# Patient Record
Sex: Female | Born: 1955 | Race: White | Hispanic: No | Marital: Married | Smoking: Former smoker
Health system: Southern US, Community
[De-identification: ages and names within clinical notes are randomized; demographics above are authoritative.]

## PROBLEM LIST (undated history)

## (undated) DIAGNOSIS — J309 Allergic rhinitis, unspecified: Secondary | ICD-10-CM

## (undated) DIAGNOSIS — E785 Hyperlipidemia, unspecified: Secondary | ICD-10-CM

## (undated) DIAGNOSIS — E119 Type 2 diabetes mellitus without complications: Secondary | ICD-10-CM

## (undated) DIAGNOSIS — K219 Gastro-esophageal reflux disease without esophagitis: Secondary | ICD-10-CM

## (undated) DIAGNOSIS — F329 Major depressive disorder, single episode, unspecified: Secondary | ICD-10-CM

## (undated) DIAGNOSIS — I2510 Atherosclerotic heart disease of native coronary artery without angina pectoris: Secondary | ICD-10-CM

## (undated) DIAGNOSIS — L409 Psoriasis, unspecified: Secondary | ICD-10-CM

## (undated) DIAGNOSIS — F32A Depression, unspecified: Secondary | ICD-10-CM

## (undated) DIAGNOSIS — I10 Essential (primary) hypertension: Secondary | ICD-10-CM

## (undated) HISTORY — DX: Allergic rhinitis, unspecified: J30.9

## (undated) HISTORY — DX: Gastro-esophageal reflux disease without esophagitis: K21.9

## (undated) HISTORY — DX: Type 2 diabetes mellitus without complications: E11.9

## (undated) HISTORY — DX: Atherosclerotic heart disease of native coronary artery without angina pectoris: I25.10

## (undated) HISTORY — DX: Psoriasis, unspecified: L40.9

## (undated) HISTORY — DX: Essential (primary) hypertension: I10

## (undated) HISTORY — PX: OTHER SURGICAL HISTORY: SHX169

## (undated) HISTORY — DX: Hyperlipidemia, unspecified: E78.5

## (undated) HISTORY — DX: Depression, unspecified: F32.A

## (undated) HISTORY — DX: Major depressive disorder, single episode, unspecified: F32.9

---

## 1998-04-25 ENCOUNTER — Ambulatory Visit (HOSPITAL_COMMUNITY): Admission: RE | Admit: 1998-04-25 | Discharge: 1998-04-25 | Payer: Self-pay | Admitting: Family Medicine

## 1998-04-25 ENCOUNTER — Encounter: Payer: Self-pay | Admitting: Family Medicine

## 1998-08-10 ENCOUNTER — Encounter: Payer: Self-pay | Admitting: Family Medicine

## 1998-08-10 ENCOUNTER — Ambulatory Visit (HOSPITAL_COMMUNITY): Admission: RE | Admit: 1998-08-10 | Discharge: 1998-08-10 | Payer: Self-pay | Admitting: Family Medicine

## 2001-11-09 ENCOUNTER — Encounter: Admission: RE | Admit: 2001-11-09 | Discharge: 2001-11-09 | Payer: Self-pay | Admitting: Family Medicine

## 2001-11-09 ENCOUNTER — Encounter: Payer: Self-pay | Admitting: Family Medicine

## 2003-02-25 ENCOUNTER — Encounter: Admission: RE | Admit: 2003-02-25 | Discharge: 2003-02-25 | Payer: Self-pay | Admitting: Family Medicine

## 2003-02-25 ENCOUNTER — Encounter: Payer: Self-pay | Admitting: Family Medicine

## 2003-08-01 ENCOUNTER — Encounter: Admission: RE | Admit: 2003-08-01 | Discharge: 2003-08-01 | Payer: Self-pay | Admitting: Family Medicine

## 2003-08-24 ENCOUNTER — Encounter (INDEPENDENT_AMBULATORY_CARE_PROVIDER_SITE_OTHER): Payer: Self-pay | Admitting: Specialist

## 2003-08-24 ENCOUNTER — Ambulatory Visit (HOSPITAL_COMMUNITY): Admission: RE | Admit: 2003-08-24 | Discharge: 2003-08-24 | Payer: Self-pay | Admitting: Orthopedic Surgery

## 2003-08-24 ENCOUNTER — Ambulatory Visit (HOSPITAL_BASED_OUTPATIENT_CLINIC_OR_DEPARTMENT_OTHER): Admission: RE | Admit: 2003-08-24 | Discharge: 2003-08-24 | Payer: Self-pay | Admitting: Orthopedic Surgery

## 2004-10-08 ENCOUNTER — Encounter: Admission: RE | Admit: 2004-10-08 | Discharge: 2004-10-08 | Payer: Self-pay | Admitting: *Deleted

## 2004-10-09 ENCOUNTER — Ambulatory Visit (HOSPITAL_COMMUNITY): Admission: RE | Admit: 2004-10-09 | Discharge: 2004-10-10 | Payer: Self-pay | Admitting: *Deleted

## 2004-12-11 ENCOUNTER — Observation Stay (HOSPITAL_COMMUNITY): Admission: EM | Admit: 2004-12-11 | Discharge: 2004-12-12 | Payer: Self-pay | Admitting: Emergency Medicine

## 2005-07-08 HISTORY — PX: TOTAL ABDOMINAL HYSTERECTOMY W/ BILATERAL SALPINGOOPHORECTOMY: SHX83

## 2005-11-18 ENCOUNTER — Encounter: Admission: RE | Admit: 2005-11-18 | Discharge: 2005-11-18 | Payer: Self-pay | Admitting: Family Medicine

## 2005-11-22 ENCOUNTER — Encounter: Admission: RE | Admit: 2005-11-22 | Discharge: 2005-11-22 | Payer: Self-pay | Admitting: Family Medicine

## 2005-11-29 ENCOUNTER — Encounter: Admission: RE | Admit: 2005-11-29 | Discharge: 2005-11-29 | Payer: Self-pay | Admitting: Family Medicine

## 2005-12-11 ENCOUNTER — Other Ambulatory Visit: Admission: RE | Admit: 2005-12-11 | Discharge: 2005-12-11 | Payer: Self-pay | Admitting: Obstetrics and Gynecology

## 2006-04-22 ENCOUNTER — Encounter (INDEPENDENT_AMBULATORY_CARE_PROVIDER_SITE_OTHER): Payer: Self-pay | Admitting: *Deleted

## 2006-04-22 ENCOUNTER — Inpatient Hospital Stay (HOSPITAL_COMMUNITY): Admission: RE | Admit: 2006-04-22 | Discharge: 2006-04-24 | Payer: Self-pay | Admitting: Obstetrics and Gynecology

## 2008-12-20 ENCOUNTER — Encounter: Admission: RE | Admit: 2008-12-20 | Discharge: 2008-12-20 | Payer: Self-pay | Admitting: Family Medicine

## 2009-10-30 ENCOUNTER — Encounter: Admission: RE | Admit: 2009-10-30 | Discharge: 2009-10-30 | Payer: Self-pay | Admitting: Gastroenterology

## 2009-10-30 HISTORY — PX: ESOPHAGOGASTRODUODENOSCOPY: SHX1529

## 2009-10-30 HISTORY — PX: COLONOSCOPY: SHX174

## 2010-11-23 NOTE — Cardiovascular Report (Signed)
Heidi Serrano, Heidi Serrano NO.:  [id number]   MEDICAL RECORD NO.:  [id number]          PATIENT TYPE:  OIB   LOCATION:  2899                         FACILITY:  [facility]   PHYSICIAN:  Lyn Records III, M.D.DATE OF BIRTH:  [birth year]   DATE OF PROCEDURE:  10/09/2004  DATE OF DISCHARGE:                              CARDIAC CATHETERIZATION   INDICATION FOR PROCEDURE:  Totally occluded distal right coronary.   PROCEDURE PERFORMED:  Angioplasty of the distal right coronary.   DESCRIPTION:  The patient underwent diagnostic catheterization by Dr.  Fraser Din with a 6-French system. The patient has totally occluded distal RCA  with left-to-right collaterals noted on angiography. As the patient has been  having symptomatic exertional angina and the lesion appeared favorable for  possible recanalization of CTO, we decided to proceed. The patient was given  4800 units of IV heparin and the ACT was documented to be greater than 250  seconds. We then used an over the wire system, a BMW wire, followed by a Hi-  Torque intermediate wire followed by a CrossIt XT 100 and followed by a  choice PT wire. We were able to penetrate the total occlusion and to get  very near the exit into the distal bed but the wire would continue to be  hung up and could never be freely manipulated into the distal vascular  territory. After approximately 50 minutes of wire manipulation, we  terminated the case. The patient remained stable and Angioseal arteriotomy  closure was performed.  Good hemostasis was achieved. Good pedal pulse was  noted.   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   PLAN:  Medical therapy per Dr. Hillary Bow.      HWS/MEDQ  D:  10/09/2004  T:  10/09/2004  Job:  [id number]   cc:   Gretta Arab. Valentina Lucks, M.D.  [address] [place]  [place] [address]  Fax: [phone]

## 2010-11-23 NOTE — Op Note (Signed)
NAMEMARCO, ADELSON NO.:  [id number]   MEDICAL RECORD NO.:  [id number]          PATIENT TYPE:  INP   LOCATION:  9310                          FACILITY:  [facility]   PHYSICIAN:  Gerald Leitz, MD          DATE OF BIRTH:  [birth year]   DATE OF PROCEDURE:  04/22/2006  DATE OF DISCHARGE:                                 OPERATIVE REPORT   PREOPERATIVE DIAGNOSIS:  Symptomatic uterine fibroids.   POSTOPERATIVE DIAGNOSIS:  Symptomatic uterine fibroids.   PROCEDURE:  Total abdominal hysterectomy, bilateral salpingo-oophorectomy  with extensive lysis of adhesions.   SURGEON:  Gerald Leitz, MD   ASSISTANT:  Dr. Elesa Massed. __________   ANESTHESIA:  General.   SPECIMEN:  Uterus, fallopian tubes and ovaries sent to pathology.  Estimated  blood loss 75 mL.  Urine output 200 mL.  Fluids 2 liters of lactated  Ringer's.   COMPLICATIONS:  None   FINDINGS:  A 14-week size fibroid uterus with adhesions of the uterus to the  anterior abdominal wall.  Anterior peritoneum and adhesions of the bladder  to the uterus as well as adhesions of the colon to the left ovary and  fallopian tube.  Both ovaries appeared normal.   DESCRIPTION OF PROCEDURE:  The patient was taken to the operating room where  she was placed under general anesthesia.  She was then prepped and draped in  the usual sterile fashion.  A Pfannenstiel skin incision was made with a  scalpel, and carried down through the underlying layer of fascia.  The  fascia was incised in the midline.  This incision was extended laterally  with Mayo scissors.  The superior aspect of fascial incision was grasped  with Kocher clamps and elevated.  The underlying rectus muscles were  dissected off with Mayo scissors.  This was repeated on the inferior aspect  of the fascial incision.  Peritoneum was identified and entered sharply with  Metzenbaum scissors.  This was extended superiorly-inferiorly.  Upon  entering the abdomen, the patient was  noted to have extensive adhesions of  the uterus to the anterior abdominal wall.  A Balfour retractor was placed.  Bladder blade could not be placed, at that time, due to the adhesions.  The  uterus was grasped with a Kelly clamp at both cornu elevated; and the uterus  was dissected from the anterior peritoneum with Metzenbaum scissors.  Lysis  of adhesions took approximately 1 hour of the total operating time.   Once the uterus was dissected from the anterior uterine wall the round  ligaments were identified.  The were suture ligated, and transected  bilaterally.  The uterovesical peritoneum was identified.  The bladder flap  was created with Metzenbaum scissors and blunt dissection.  Attention was  turned to the right infundibulopelvic ligament.  The ureter was identified  on the right; and once this was identified, the right infundibulopelvic  ligament was clamped and was transected with Mayo scissors.  The  infundibulopelvic ligament was then ligated with a free tie of #0 Vicryl  followed by a suture  ligature of #0 Vicryl.  Excellent hemostasis was noted.  Due to the adhesions of the left ovary, the utero-ovarian ligament was  clamped with the Heaney clamp and then transected with Mayo scissors.  It  was then suture ligated with the #0 free tie followed by a suture ligature  of #0 Vicryl.  Excellent hemostasis was noted.  The uterine vessels were  skeletonized.  The uterine vessels were then clamped with Heaney clamps  transected and suture ligated with #0 Vicryl bilaterally.  The cardinal  ligaments were then clamped with Heaney clamp, transected, and suture  ligated with #0 Vicryl.  Uterosacrals were clamped with Heaney clamps,  transected, and suture ligated with #0 Vicryl.  The uterus and cervix were  then removed using Mayo scissors.  The vaginal cuff was closed with suture  of #0 Vicryl in a running-locked fashion.  Excellent hemostasis was noted.   Attention was turned to the  left ovary which was grasped with a Babcock  clamp.  The adhesions of the left ovary were removed with Metzenbaum  scissors.  Again, the adhesions were noted to involve the left ovary and  left fallopian tube as well as the colon the infundibulopelvic ligament on  the left was identified.  It was clamped with a Heaney clamp, transected;  and it was then ligated with a free tie of #0 Vicryl.  Adequate hemostasis  was noted.  The abdomen and pelvis were then copiously irrigated with warm  saline.  Excellent hemostasis was noted.  All instruments were removed from  the abdomen.  The peritoneum was closed with 2-0 Vicryl in a running  fashion.  The fascia was closed with #0 PDS in a running fashion.  The skin  was closed with staples.  Sponge, lap, and needle counts were correct x2.   Two grams of Ancef were given at the beginning of the case.  There were no  complications. The patient was awakened from anesthesia and taken to the  recovery room in stable condition.      Gerald Leitz, MD  Electronically Signed     TC/MEDQ  D:  04/22/2006  T:  04/23/2006  Job:  [id number]

## 2010-11-23 NOTE — Discharge Summary (Signed)
NAMENAMYA, VOGES NO.:  [id number]   MEDICAL RECORD NO.:  [id number]          PATIENT TYPE:  INP   LOCATION:  9310                          [facility]ACILITY[facility]   PHYSICIAN:  Gerald Leitz, MD          DATE OF BIRTH:  [birth year]   DATE OF ADMISSION:  04/22/2006  DATE OF DISCHARGE:  04/24/2006                                 DISCHARGE SUMMARY   INDICATION FOR ADMISSION:  Menorrhagia, symptomatic uterine fibroids.   BRIEF HOSPITAL COURSE:  The patient underwent total abdominal hysterectomy,  bilateral salpingo-oophorectomy on April 22, 2006 due to symptomatic  uterine fibroids.  She did well postoperatively with hemoglobin of 12 on  postoperative day #1.  She received routine postoperative care.  Was  discharged on April 24, 2006 with complete bowel and bladder function.  She was discharged home on the following medications:  Motrin, Percocet.   She will follow up with Dr. Richardson Dopp in four weeks for postoperative visit.   CONDITION ON DISCHARGE:  Improved and stable.   ACTIVITY AT DISCHARGE:  Ad lib.  Pelvic rest for six weeks.      Gerald Leitz, MD  Electronically Signed     TC/MEDQ  D:  04/24/2006  T:  04/26/2006  Job:  [id number]

## 2010-11-23 NOTE — H&P (Signed)
Heidi Serrano, Heidi Serrano NO.:  [id number]   MEDICAL RECORD NO.:  [id number]          PATIENT TYPE:  AMB   LOCATION:  SDC                           [facility]   PHYSICIAN:  Gerald Leitz, MD          DATE OF BIRTH:  06-18-1956   DATE OF ADMISSION:  04/22/2006  DATE OF DISCHARGE:                                HISTORY & PHYSICAL   HISTORY:  The patient is a 55 year old G1, P1-0-0-1 with history of  symptomatic uterine fibroids.  She has failed therapy with Mirena IUD.  She  also has a history of coronary artery disease, is not a candidate for any  type of estrogen therapy.  She desires definitive therapy via total  abdominal hysterectomy and bilateral salpingo-oophorectomy.  She has been  cleared by Dr. Eldridge Dace with cardiology for this surgery.   PAST OBSTETRICAL HISTORY:  C-section x1 in 1975.   PAST GYNECOLOGIC HISTORY:  No history of sexually transmitted diseases.  Last Pap smear was June 2007, this was normal.  No history of abnormal Pap  smears.   PAST MEDICAL HISTORY:  Hypertension, angina, diverticulitis, coronary artery  disease, hypercholesterolemia.   PAST SURGICAL HISTORY:  C-section x1, laparoscopic tubal ligation,  angioplasty.   MEDICATIONS:  1. Toprol XL 100 mg one p.o. daily.  2. __________ 40 mg one p.o. daily.  3. Isosorbide 60 mg one p.o. daily.  4. Hydrochlorothiazide 25 mg half tablet as needed.  5. Prilosec OTC one daily.  6. Aspirin 325 mg one daily; the patient advised to stop this 2 weeks      prior to surgery.  7. Bupropion SR 150 mg one p.o. daily.   ALLERGIES:  NO KNOWN DRUG ALLERGIES.   SOCIAL HISTORY:  The patient is married.  She denies tobacco use, quit in  January 2000.  Occasional alcohol.  No illicit drug use.   FAMILY HISTORY:  Maternal grandmother with endometrial cancer.  Mother with  possible endometrial cancer.   REVIEW OF SYSTEMS:  Negative except as stated in the history of current  illness.   PHYSICAL  EXAMINATION:  VITAL SIGNS:  Blood pressure 130/82, weight 184  pounds, heart rate 76.  CARDIOVASCULAR:  Regular rate and rhythm.  LUNGS:  Clear to auscultation bilaterally.  ABDOMEN:  Soft, nontender, positive bowel sounds.  EXTREMITIES:  No clubbing, cyanosis, or edema.  PELVIC EXAM:  Normal external female genitalia.  No vulvar, vaginal,  cervical lesions noted.  Bimanual exam reveals a 14-week-size uterus.  No  adnexal masses or tenderness.   IMPRESSION AND PLAN:  Symptomatic uterine fibroids.  The patient desires  definitive therapy with total abdominal hysterectomy/bilateral salpingo-  oophorectomy.  Risks, benefits, alternatives of surgery are discussed with  the patient including, but not limited to, infection, bleeding, damage to  bowel, bladder, surrounding organs with need for further surgery, risk for  deep vein thrombosis discussed as well.  The patient understands the above  risks and desires to proceed with total abdominal hysterectomy and bilateral  salpingo-oophorectomy.      Gerald Leitz, MD  Electronically Signed     TC/MEDQ  D:  04/21/2006  T:  04/21/2006  Job:  [id number]

## 2010-11-23 NOTE — Cardiovascular Report (Signed)
[name], Heidi Serrano NO.:  [id number]   MEDICAL RECORD NO.:  [id number]          PATIENT TYPE:  OIB   LOCATION:  2899                         FACILITY:  [facility]   PHYSICIAN:  Meade Maw, M.D.    DATE OF BIRTH:  1956/01/29   DATE OF PROCEDURE:  10/09/2004  DATE OF DISCHARGE:                              CARDIAC CATHETERIZATION   INDICATIONS FOR PROCEDURE:  This is a 55 year old female with ongoing chest  pain for approximately 6 weeks.  Stress Cardiolite was performed revealing  inferior posterolateral ischemia.   DESCRIPTION OF PROCEDURE:  After obtaining written and informed consent, the  patient was brought to the cardiac catheterization in a sedative state.  Preop sedation was achieved using Versed 2 mg IV.  The right groin was  prepped and draped in the usual sterile fashion.  Local anesthesia was  achieved using 1% Xylocaine.  A 6-French hemostasis sheath was placed into  the right femoral artery using the modified Seldinger technique.  Selective  coronary angiography was performed using a JL4 and a nontorque right.  Single ventriculogram was performed in the RAO position using a 6-French  pigtail curved catheter.  All catheter exchanges were made over guide wire.  The hemostasis sheath was flushed following the catheter exchange.  The  films were revealed with Dr. Verdis Prime.  It was felt that intervention on  the right coronary artery would be appropriate.  These findings were  discussed with the patient.  She wished to proceed.   FINDINGS:  Aortic pressure 133/84.  LV pressure 140/11.  EDP 19.   Single plain ventriculogram revealed severe hypokinesis into inferior wall.  She was noted to have post PVC MR only.  Her ejection fraction was 50-55%.   Coronary angiography:  The left main coronary artery bifurcates into the  left anterior descending and circumflex vessel.  There is no disease noted  in the left main coronary artery.   Left anterior  descending gives rise to a trivial D-1, larger D-2, small D-3  and goes on to end as an apical recurrent branch.  There is luminal  irregularities in the left anterior descending only.   Circumflex vessel is a small and moderate caliber vessel giving rise to a  trivial OM-1, small OM-2, ends as a lateral branch.  There is no disease  noted in circumflex or its branches.   The right coronary artery is dominant for posterior circulation.  The right  coronary artery has a 100% mid vessel occlusion.  There are collaterals from  the left to the right.   IMPRESSION:  1.  One-hundred percent mid vessel occlusion accounting for the patient's      symptoms.  She does have collaterals from the left system.  2.  Inferobasal hypokinesis with ejection fraction 55-60%.  3.  These films have been reviewed with Dr. Verdis Prime.  He will proceed      with intervention on the right coronary artery.      HP/MEDQ  D:  10/09/2004  T:  10/09/2004  Job:  [id number]   cc:  Gretta Arab Valentina Lucks, M.D.  [address] [place]  [place] [address]  Fax: [phone]

## 2010-11-23 NOTE — H&P (Signed)
NAME:  Heidi Serrano, Heidi Serrano NO.:  [id number]   MEDICAL RECORD NO.:  [id number]           PATIENT TYPE:   LOCATION:                                [facility]   PHYSICIAN:  Gerald Leitz, MD          DATE OF BIRTH:  July 04, 1956   DATE OF ADMISSION:  DATE OF DISCHARGE:                                HISTORY & PHYSICAL   Patient scheduled for surgery on April 22, 2006.  Date of this exam  March 25, 2006.   HISTORY OF PRESENT ILLNESS:  This is a 55 year old G1, P1-0-0-1, with  symptomatic uterine fibroids and menorrhagia who desires definitive therapy  via total abdominal hysterectomy and bilateral salpingo-oophorectomy.  She  has tried [place] IUD in the past for control of menorrhagia and states that  she had severe pain and cramping with the IUD.  She has history of coronary  artery disease, has recently seen Corky Crafts, MD, a cardiologist,  for cardiac clearance.  This was received.  Last menstrual period was March 05, 2006, lasting four to five days.  Says her periods are extremely heavy  where she soils her clothes, saturates a pad or tampon in 30 to 45 minutes.  The patient has also tried oral contraceptives for menorrhagia, had nausea  and undesirable side effects with the oral contraceptives.   GYN HISTORY:  Tubal ligation in 1989, no history of sexually transmitted  diseases, history of cesarean section x 1 in 1975.  No history of abnormal  Pap smears.  Last Pap smear was June 2007 and this was negative for  intraepithelial lesions or malignancy.  She had endometrial biopsy in June  of 2007 negative for endometrial hyperplasia or malignancy.   PAST MEDICAL HISTORY:  Hypertension, hypercholesterolemia, coronary artery  disease, diverticulitis and angina.   PAST SURGICAL HISTORY:  Angioplasty.  Laparoscopic tubal ligation.  Cesarean  section.  Right hand surgery.   MEDICATIONS:  1. Toprol XL 100 mg 1 by mouth daily.  2. Lovastatin 40 mg 1  daily.  3. Isosorbide 60 mg 1 daily.  4. Hydrochlorothiazide 25 mg half when needed for fluid.  5. Prilosec 1 daily.  6. Aspirin 325 mg 1 daily.  7. 150 mg 1 daily.   ALLERGIES:  NO KNOWN DRUG ALLERGIES.   SOCIAL HISTORY:  The patient is married.  She quit smoking in January of  __________.  Occasional alcohol use, no illicit drug use.   FAMILY HISTORY:  Maternal grandmother with endometrial cancer.  Mother with  possible history of endometrial cancer.   REVIEW OF SYSTEMS:  Negative except as stated in history of present illness.   PHYSICAL EXAMINATION:  VITAL SIGNS:  Blood pressure 130/82, heart rate 76,  weight 184 pounds.  CARDIOVASCULAR:  Regular rate and rhythm.  LUNGS:  Clear to auscultation bilaterally.  ABDOMEN:  Soft, nontender, nondistended, no masses.  EXTREMITIES:  No cyanosis, clubbing or edema.  PELVIC:  Normal external female genitalia.  No vulvar, vaginal or cervical  lesions noted.  Bimanual exam reveals approximately 14 week size  uterus.  No  adnexal masses or tenderness.   IMPRESSION AND PLAN:  Symptomatic uterine fibroids and menorrhagia, failed  medical therapy and IUD.  The patient desires definitive therapy with total  abdominal hysterectomy and bilateral salpingo-oophorectomy.  Cardiac  clearance has been received.  Cardiologist recommended 100 mg of Toprol XL  daily.  Risks, benefits and alternatives of TAH/BSO were discussed with the  patient including but not limited to infection, bleeding, damage to bowel,  bladder and surrounding organs with need for further surgery, risk of  transfusion, HIV, hepatitis B and C were discussed.  The patient was also  informed of risks of deep venous thrombosis associated with surgery.  She  understands these risks and desires to proceed.      Gerald Leitz, MD  Electronically Signed     TC/MEDQ  D:  03/25/2006  T:  03/25/2006  Job:  [id number]

## 2010-11-23 NOTE — Op Note (Signed)
NAME:  Heidi Serrano, Heidi Serrano                          ACCOUNT NO.:  [id number]   MEDICAL RECORD NO.:  [id number]                   PATIENT TYPE:  AMB   LOCATION:  DSC                                  FACILITY:  [facility]   PHYSICIAN:  Artist Pais. Mina Marble, M.D.           DATE OF BIRTH:  1956-04-26   DATE OF PROCEDURE:  08/24/2003  DATE OF DISCHARGE:                                 OPERATIVE REPORT   PREOPERATIVE DIAGNOSES:  1. Right wrist volar cyst.  2. 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   POSTOPERATIVE DIAGNOSES:  1. Right wrist volar cyst.  2. 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   PROCEDURES:  1. Right long finger A1 pulley release with flexor synovectomy.  2. Right wrist volar cyst excision.   SURGEON:  Artist Pais. Mina Marble, M.D.   ASSISTANT:  Aura Fey. Bobbe Medico.   ANESTHESIA:  Regional anesthetic.   TOURNIQUET TIME:  35 minutes.   COMPLICATIONS:  None.   DRAINS:  None.   SPECIMENS:  Two specimens were sent.   DESCRIPTION OF PROCEDURE:  The patient was taken to the operating room.  After the induction of adequate regional anesthetic, the right upper  extremity is prepped and draped in usual sterile fashion.  once this was  done, an oblique incision was made over the A1 pulley area of the long  finger on the right.  Incision was taken down through the skin and  subcutaneous tissues.  The A1 pulley was identified and released.  The  __________ tendons were exposed.  There was significant hypertrophic  synovium overlying both these tendons.  The incision extended proximally and  distally for another centimeter and a flexor synovectomy was performed of  the long finger.  After this was completed, a second incision was made over  the radial aspect of the distal forearm and wrist area over a palpable mass.  The incision was taken down to skin and subcutaneous tissues.  Small branch  of the radial artery was retracted and  again the cyst was excised.  Wound  was then thoroughly irrigated.  This was also sent for pathologic  confirmation.  This was closed with a running 3-0 Prolene subcuticular  stitch as well.  Steri-Strips, 4x4s, fluffs, compressive hand dressing was  applied.  The patient tolerated the procedure well.  She went to the  recovery room in stable condition.                                               Artist Pais Mina Marble, M.D.    MAW/MEDQ  D:  08/24/2003  T:  08/24/2003  Job:  [id number]

## 2011-11-05 IMAGING — RF DG BE W/ AIR HIGH DENSITY
13 of 24 series · 13 of 24 positions shown · IV contrast (agent unspecified)
Comparison: None.

CLINICAL DATA: Incomplete colonoscopy due to tortuous colon

AIR-CONTRAST BARIUM ENEMA
TECHNIQUE: After obtaining a scout radiograph air-contrast barium
enema was performed under fluoroscopy using high-density barium.
Fluoroscopy Time:
Contrast: Double contrast barium enema

[Series 1: run · 1 of 1 slices shown (1 of 9)]
[im 1/1]
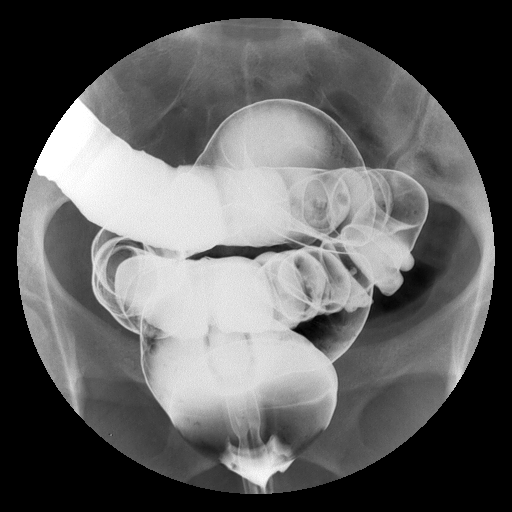

[Series 3: run · 1 of 1 slices shown (2 of 9)]
[im 1/1]
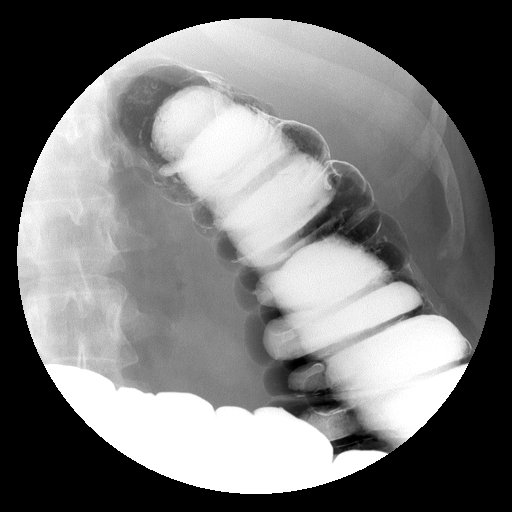

[Series 5: run · 1 of 1 slices shown (3 of 9)]
[im 1/1]
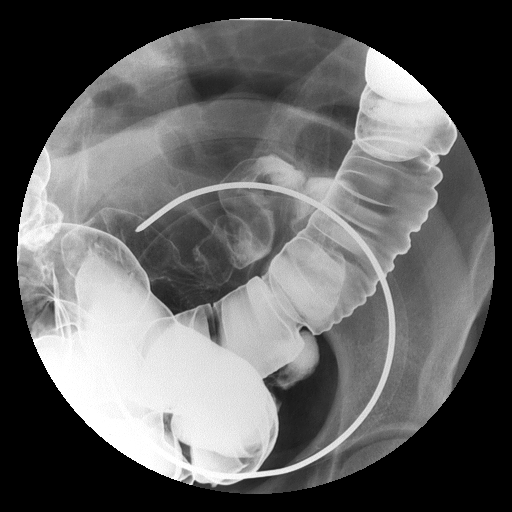

[Series 7: run · 1 of 1 slices shown (4 of 9)]
[im 1/1]
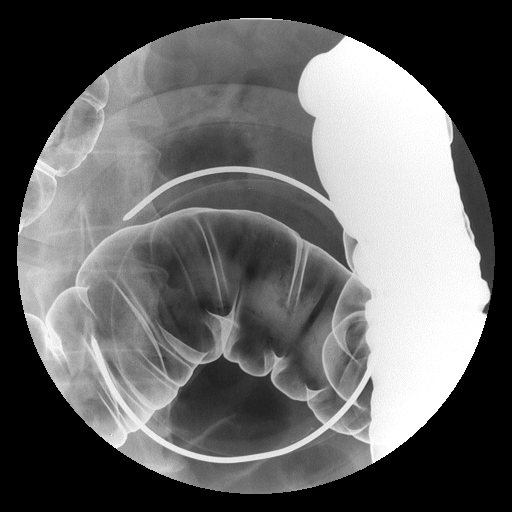

[Series 9: run · 1 of 1 slices shown (5 of 9)]
[im 1/1]
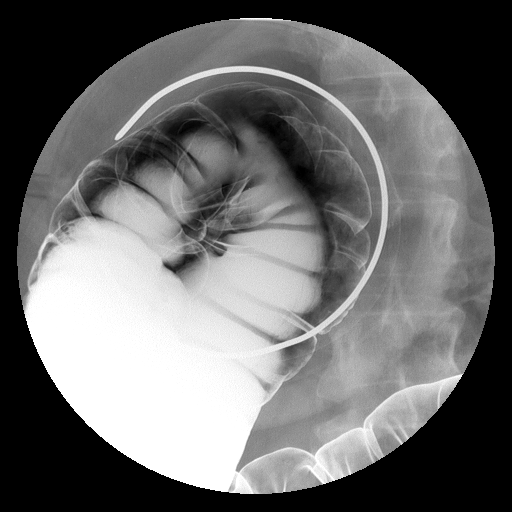

[Series 11: run · 1 of 1 slices shown (6 of 9)]
[im 1/1]
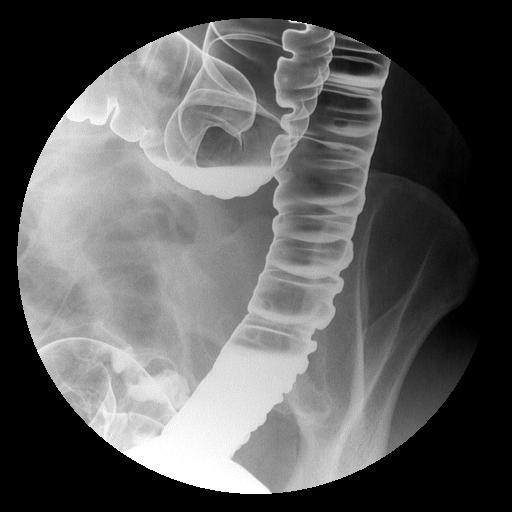

[Series 13: run · 1 of 1 slices shown (7 of 9)]
[im 1/1]
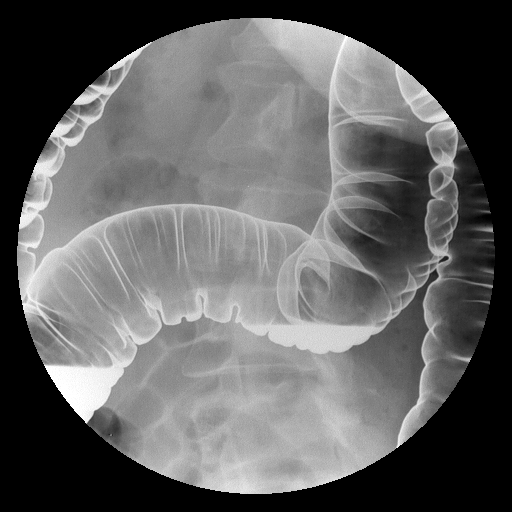

[Series 14: run · 1 of 1 slices shown (8 of 9)]
[im 1/1]
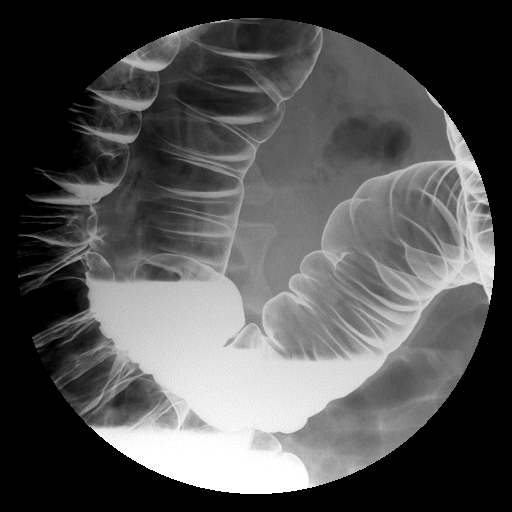

[Series 16: run · 1 of 1 slices shown (9 of 9)]
[im 1/1]
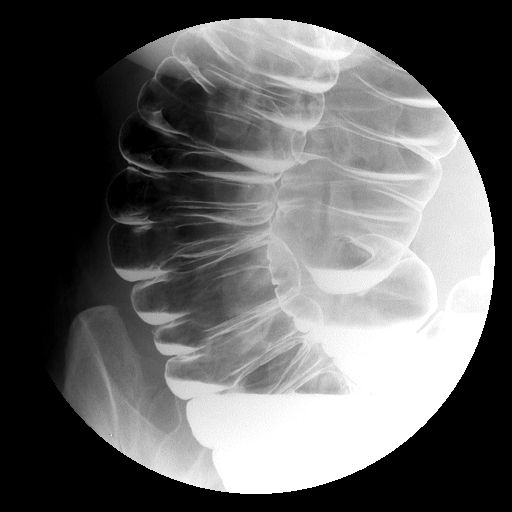

[Series 1002: view not recorded · 0.20mm/px · 1 of 1 slices shown (1 of 4)]
[im 1/1]
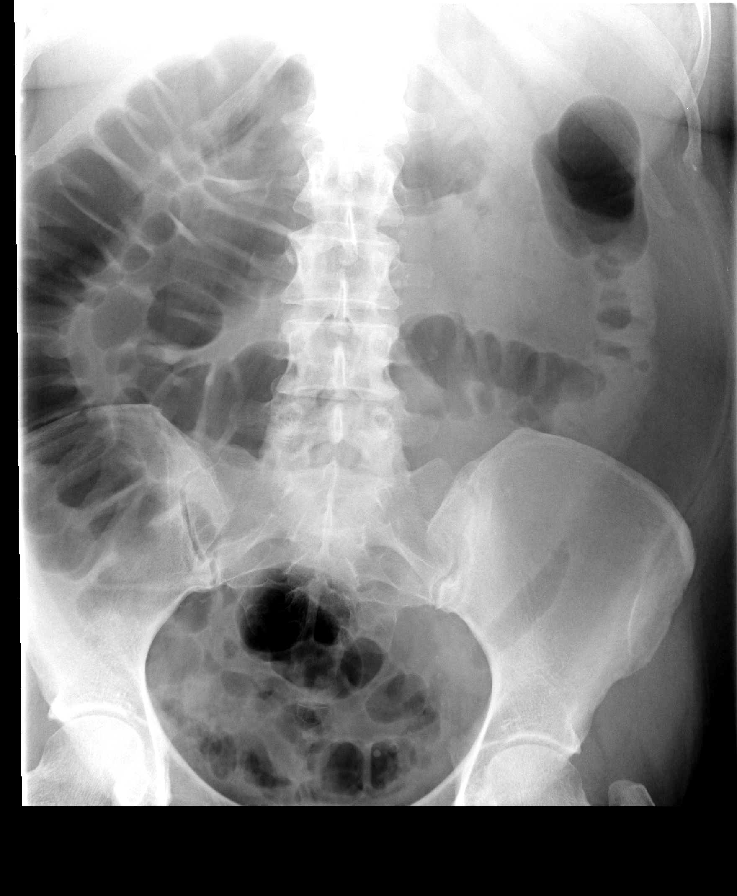

[Series 1004: view not recorded · 0.20mm/px · 1 of 1 slices shown (2 of 4)]
[im 1/1]
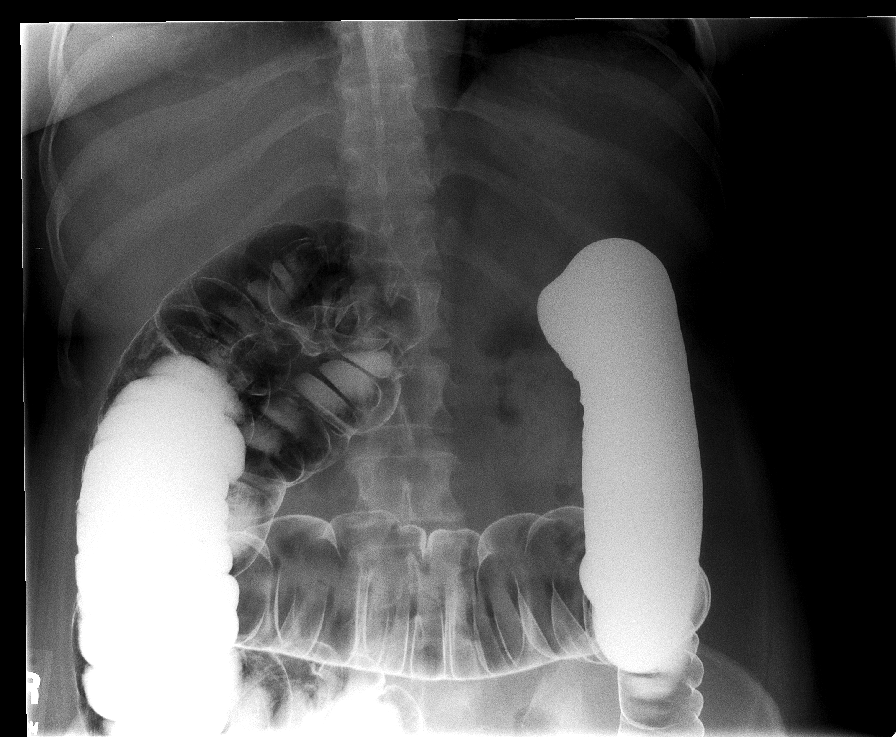

[Series 1006: view not recorded · 0.20mm/px · 1 of 1 slices shown (3 of 4)]
[im 1/1]
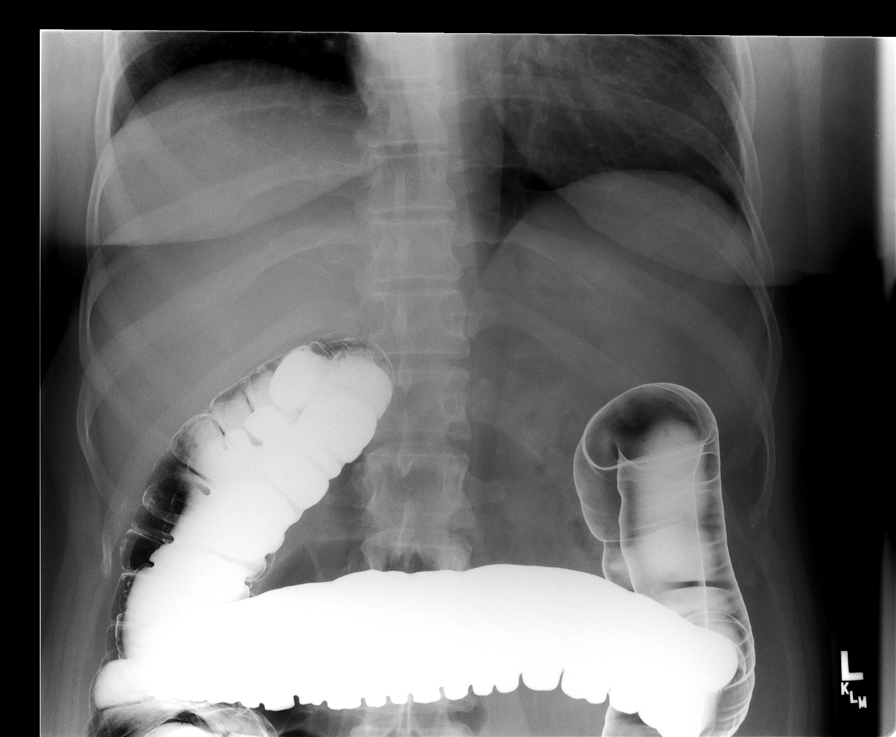

[Series 1008: view not recorded · 0.20mm/px · 1 of 1 slices shown (4 of 4)]
[im 1/1]
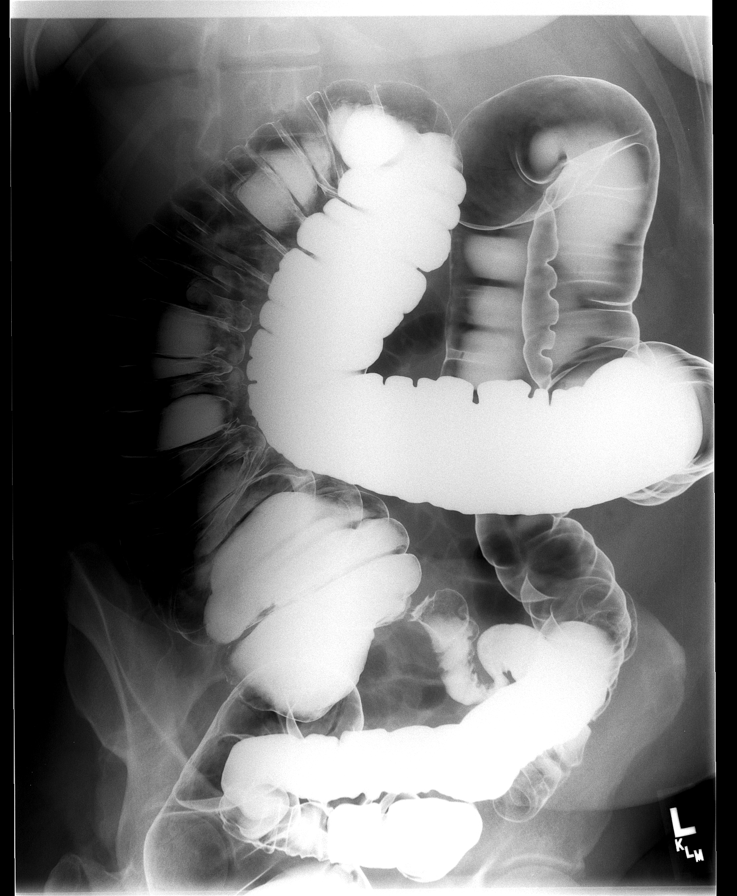

[13 of 24 positions shown; findings below may reference images not displayed]

FINDINGS: A preliminary film of the abdomen shows some air
throughout the colon from recent colonoscopy.

A double contrast barium enema was performed. The rectosigmoid
colon is slightly tortuous and there are a few small scattered
diverticula present.  The remainder of the colon is relatively well
visualized.  No persistent polypoid lesion or constricting lesion
is seen.  The cecum and terminal ileum appear normal.
IMPRESSION: No significant abnormality on barium enema with air.  Only a few
small sigmoid colon diverticula present.

## 2013-02-19 ENCOUNTER — Other Ambulatory Visit: Payer: Self-pay | Admitting: Family Medicine

## 2013-02-19 DIAGNOSIS — R1013 Epigastric pain: Secondary | ICD-10-CM

## 2013-02-26 ENCOUNTER — Ambulatory Visit
Admission: RE | Admit: 2013-02-26 | Discharge: 2013-02-26 | Disposition: A | Discharge status: Home / Self Care | Payer: BC Managed Care – PPO | Source: Ambulatory Visit | Attending: Family Medicine | Admitting: Family Medicine

## 2013-02-26 DIAGNOSIS — R1013 Epigastric pain: Secondary | ICD-10-CM

## 2013-11-09 ENCOUNTER — Encounter: Payer: Self-pay | Admitting: Interventional Cardiology

## 2013-12-27 ENCOUNTER — Ambulatory Visit: Payer: BC Managed Care – PPO | Admitting: Interventional Cardiology

## 2014-04-30 ENCOUNTER — Encounter: Payer: Self-pay | Admitting: *Deleted

## 2015-03-04 IMAGING — US US ABDOMEN LIMITED
1 series · 14 of 25 positions shown · non-contrast
Comparison: None.

CLINICAL DATA: Epigastric pain

LIMITED ABDOMINAL ULTRASOUND - RIGHT UPPER QUADRANT

[Series 1: us abdomen limited · 0.23mm/px · 14 of 54 slices shown]
[im 1/54]
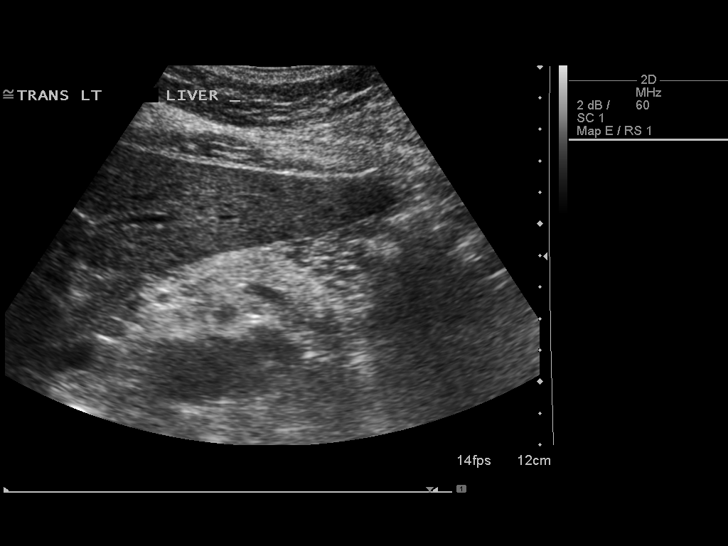
[im 5/54]
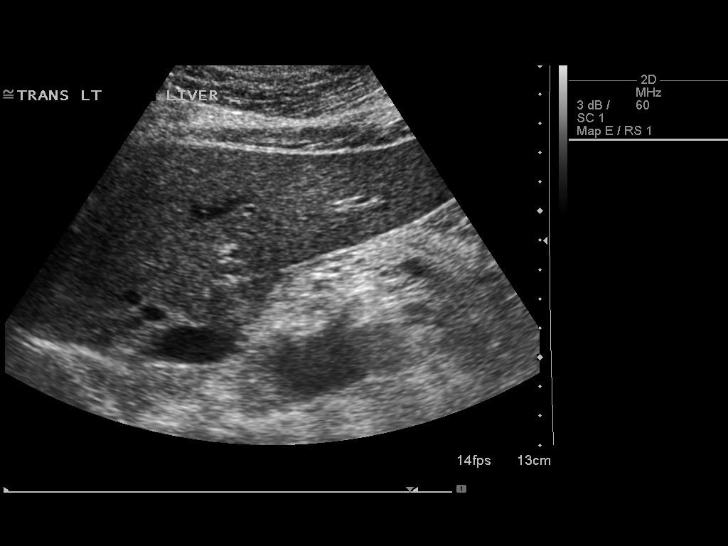
[im 9/54]
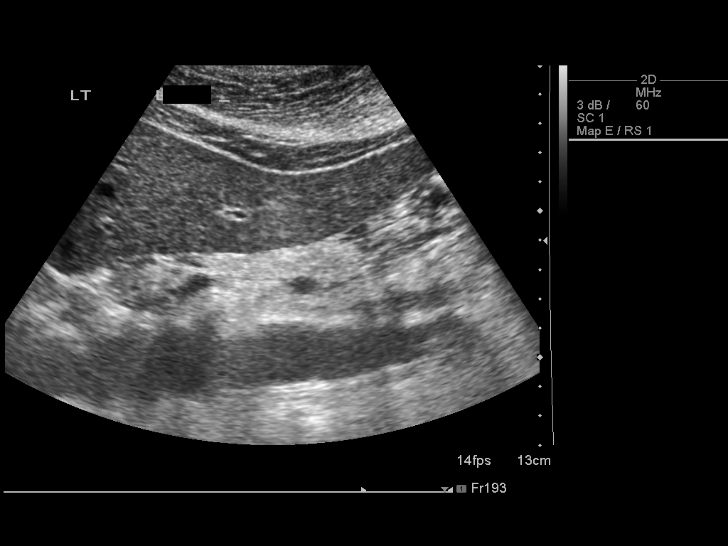
[im 14/54]
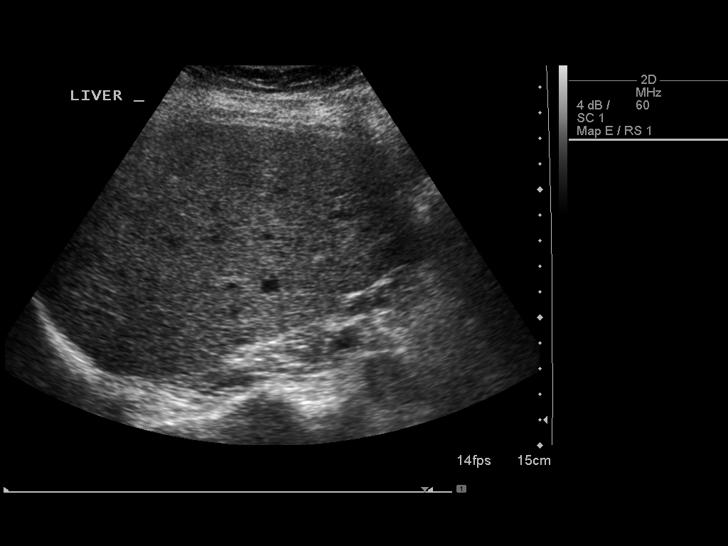
[im 18/54]
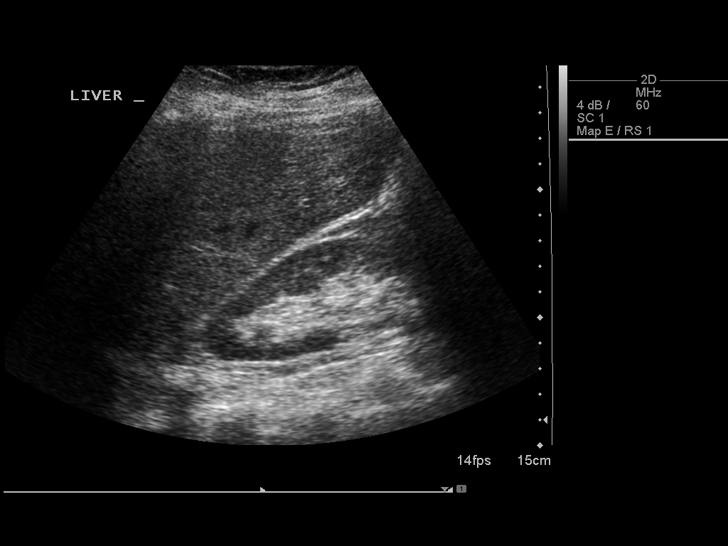
[im 20/54]
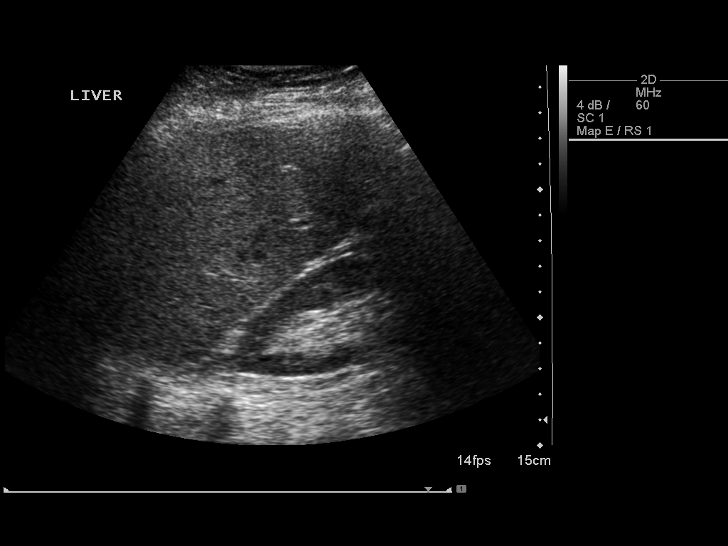
[im 25/54]
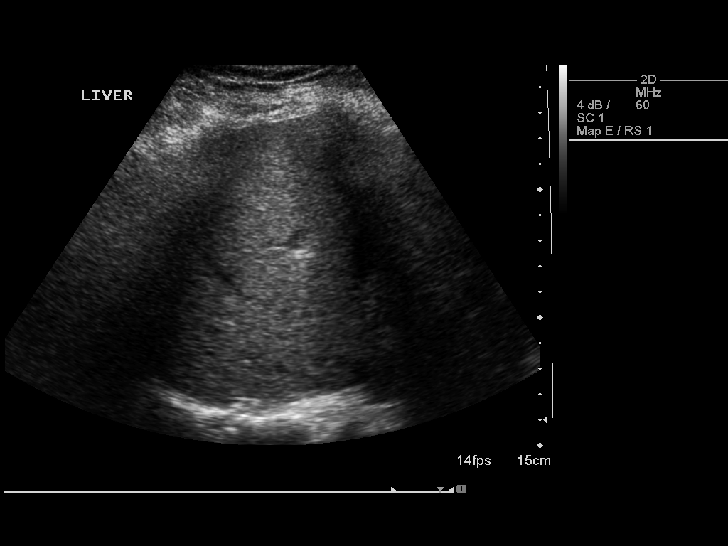
[im 29/54]
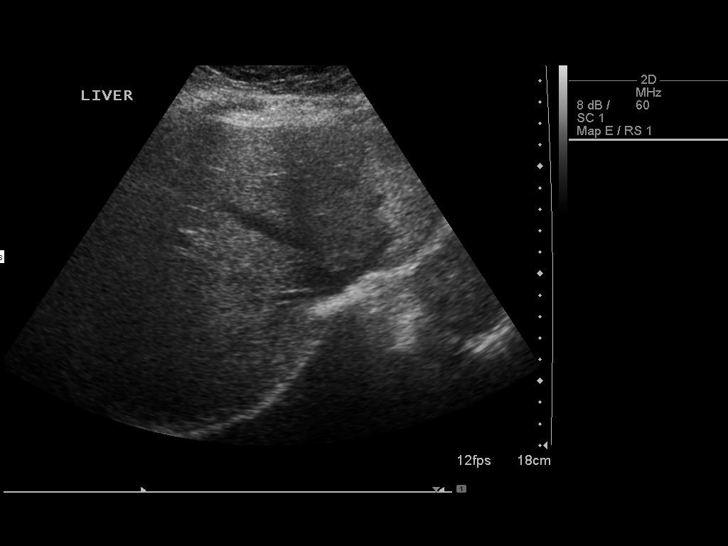
[im 34/54]
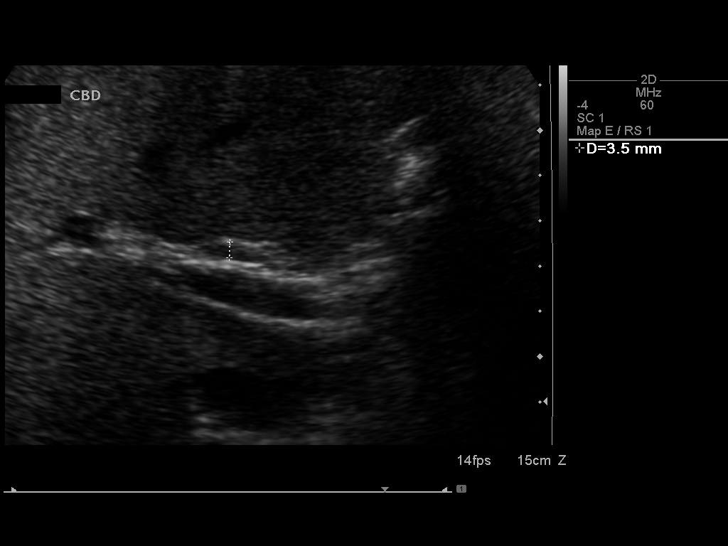
[im 36/54]
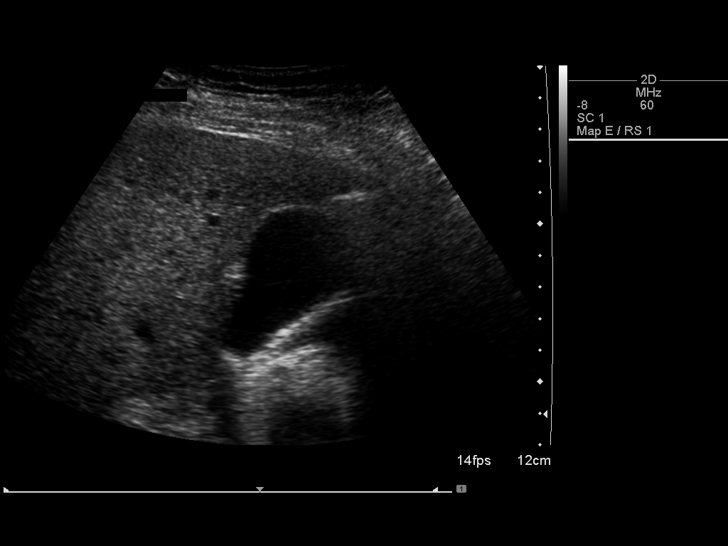
[im 40/54]
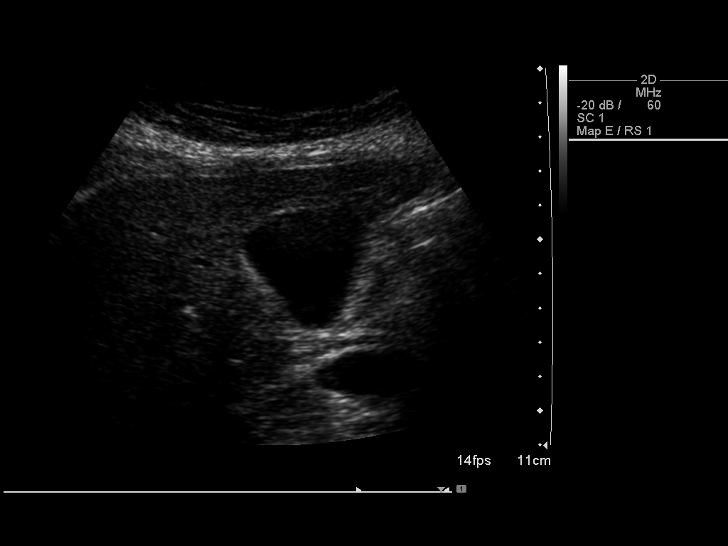
[im 45/54]
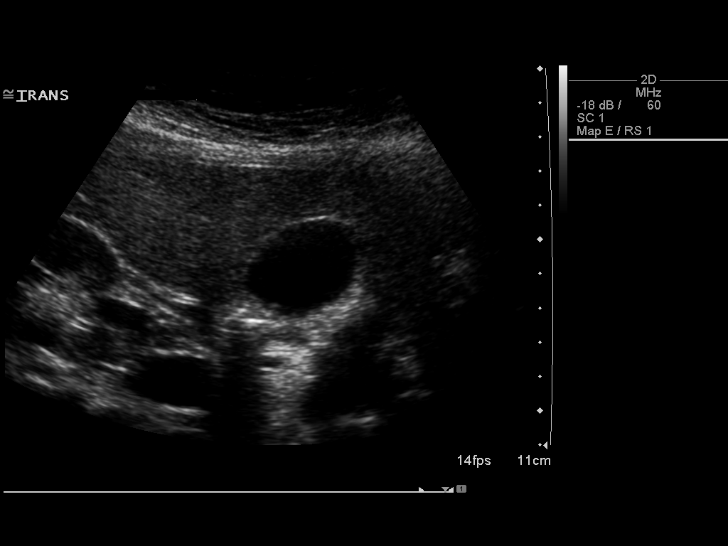
[im 49/54]
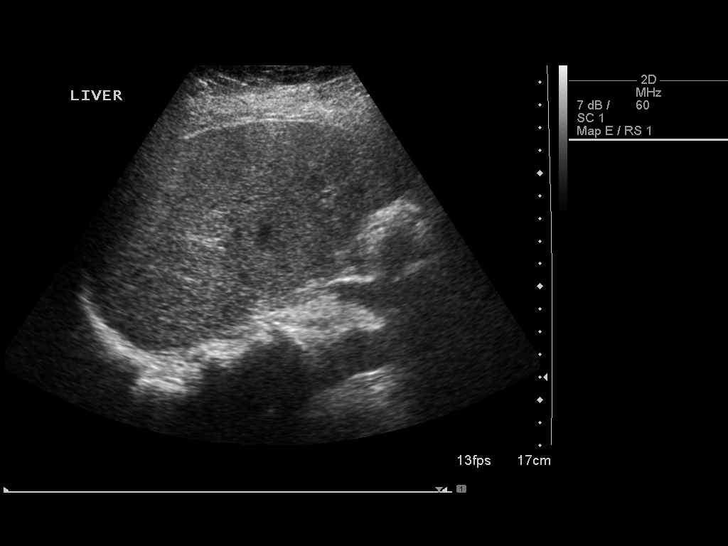
[im 54/54]
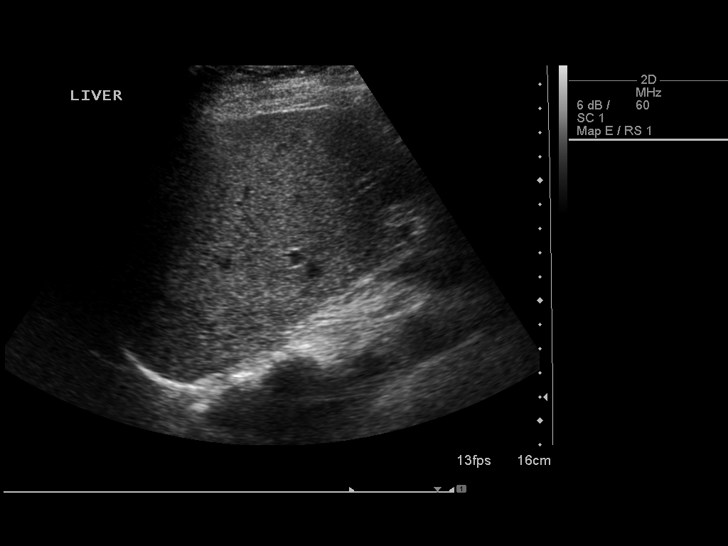

[14 of 25 positions shown; findings below may reference images not displayed]

FINDINGS: Gallbladder:  Well distended without evidence of cholelithiasis.  A
negative Murphy's sign is seen.  No gallbladder wall thickening is
noted.

Common bile duct:  3.5 mm.

Liver:  Normal in echogenicity.  No biliary ductal dilatation is
seen.
IMPRESSION: No acute abnormality noted.

## 2015-10-26 ENCOUNTER — Encounter: Payer: Self-pay | Admitting: Podiatry

## 2015-10-26 ENCOUNTER — Ambulatory Visit: Payer: Self-pay

## 2015-10-26 ENCOUNTER — Ambulatory Visit (INDEPENDENT_AMBULATORY_CARE_PROVIDER_SITE_OTHER): Payer: 59

## 2015-10-26 ENCOUNTER — Ambulatory Visit (INDEPENDENT_AMBULATORY_CARE_PROVIDER_SITE_OTHER): Payer: 59 | Admitting: Podiatry

## 2015-10-26 VITALS — BP 145/86 | HR 62 | Resp 16 | Ht 63.0 in | Wt 182.0 lb

## 2015-10-26 DIAGNOSIS — M79673 Pain in unspecified foot: Secondary | ICD-10-CM

## 2015-10-26 DIAGNOSIS — M79672 Pain in left foot: Secondary | ICD-10-CM

## 2015-10-26 DIAGNOSIS — M779 Enthesopathy, unspecified: Secondary | ICD-10-CM

## 2015-10-26 DIAGNOSIS — M722 Plantar fascial fibromatosis: Secondary | ICD-10-CM

## 2015-10-26 DIAGNOSIS — M79671 Pain in right foot: Secondary | ICD-10-CM

## 2015-10-26 MED ORDER — TRIAMCINOLONE ACETONIDE 10 MG/ML IJ SUSP
10.0000 mg | Freq: Once | INTRAMUSCULAR | Status: AC
  Administered 2015-10-26: 10 mg

## 2015-10-26 MED ORDER — DICLOFENAC SODIUM 75 MG PO TBEC: 75.0000 mg | DELAYED_RELEASE_TABLET | Freq: Two times a day (BID) | ORAL | Status: AC

## 2015-10-26 NOTE — Progress Notes (Signed)
   Subjective:    Patient ID: Heidi Serrano, female    DOB: 10/19/1955, 60 y.o.   MRN: [id number]  HPI Chief Complaint  Patient presents with  . Foot Pain    Bilateral; heel; Right foot-plantar; pt stated, "Right foot hurts more; when wearing sock and shoe on right foot, foot is throbbing; sometimes gets sharp pains"; x1.5 months      Review of Systems  All other systems reviewed and are negative.      Objective:   Physical Exam        Assessment & Plan:

## 2015-10-26 NOTE — Patient Instructions (Signed)

## 2015-10-27 NOTE — Progress Notes (Signed)
Subjective:     Patient ID: Heidi Serrano, female   DOB: 11/23/1955, 60 y.o.   MRN: [id number]  HPI patient presents stating he's been getting heel pain right over left and it hurts worse when he does a lot of walking. Patient states she had this and years ago but it's been a number years and she's had attack and been present for several months and worse over the last 6 weeks   Review of Systems  All other systems reviewed and are negative.      Objective:   Physical Exam  Constitutional: She is oriented to person, place, and time.  Cardiovascular: Intact distal pulses.   Musculoskeletal: Normal range of motion.  Neurological: She is oriented to person, place, and time.  Skin: Skin is warm.  Nursing note and vitals reviewed.  neurovascular status intact muscle strength adequate range of motion within normal limits with patient noted to have exquisite discomfort in the heel right and left at the insertional point of the tendon into the calcaneus with fluid buildup around the medial band. Patient states that it is worse in the morning and after sitting and patient's noted to have mild depression of the arch with good digital perfusion and well oriented 3     Assessment:     Plantar fasciitis heel region right over left with fluid buildup    Plan:     H&P and x-rays reviewed with patient. Injected the plantar fascia bilateral 3 mg Kenalog 5 mg Xylocaine and applied fascial brace bilateral and gave instructions on physical therapy supportive shoe gear. Patient will be seen back 2 weeks  X-ray report indicated that there is spur formation but no indications of stress fracture or arthritis

## 2015-11-09 ENCOUNTER — Ambulatory Visit: Payer: 59 | Admitting: Podiatry

## 2015-11-13 ENCOUNTER — Encounter: Payer: Self-pay | Admitting: Podiatry

## 2015-11-13 ENCOUNTER — Ambulatory Visit (INDEPENDENT_AMBULATORY_CARE_PROVIDER_SITE_OTHER): Payer: 59 | Admitting: Podiatry

## 2015-11-13 DIAGNOSIS — M722 Plantar fascial fibromatosis: Secondary | ICD-10-CM

## 2015-11-14 NOTE — Progress Notes (Signed)
Subjective:     Patient ID: Heidi Serrano, female   DOB: 10/24/1955, 60 y.o.   MRN: [id number]  HPI patient presents stating that her right heel is feeling a lot better   Review of Systems     Objective:   Physical Exam Neurovascular status intact muscle strength adequate range of motion within normal limits with patient found to have discomfort of a minimal degree plantar aspect right heel    Assessment:     Improving plantar fasciitis right    Plan:     Advised on physical therapy supportive shoes anti-inflammatories and patient will be seen back to recheck again in the next 4-6 weeks

## 2016-06-12 ENCOUNTER — Ambulatory Visit (INDEPENDENT_AMBULATORY_CARE_PROVIDER_SITE_OTHER): Payer: Managed Care, Other (non HMO) | Admitting: Podiatry

## 2016-06-12 ENCOUNTER — Encounter: Payer: Self-pay | Admitting: Podiatry

## 2016-06-12 ENCOUNTER — Ambulatory Visit (INDEPENDENT_AMBULATORY_CARE_PROVIDER_SITE_OTHER): Payer: Managed Care, Other (non HMO)

## 2016-06-12 VITALS — BP 164/85 | HR 57 | Resp 16

## 2016-06-12 DIAGNOSIS — M1000 Idiopathic gout, unspecified site: Secondary | ICD-10-CM

## 2016-06-12 DIAGNOSIS — M79671 Pain in right foot: Secondary | ICD-10-CM

## 2016-06-12 DIAGNOSIS — M779 Enthesopathy, unspecified: Secondary | ICD-10-CM | POA: Diagnosis not present

## 2016-06-12 MED ORDER — METHYLPREDNISOLONE 4 MG PO TBPK: ORAL_TABLET | ORAL | 0 refills | Status: DC

## 2016-06-12 MED ORDER — TRIAMCINOLONE ACETONIDE 10 MG/ML IJ SUSP
10.0000 mg | Freq: Once | INTRAMUSCULAR | Status: AC
  Administered 2016-06-12: 10 mg

## 2016-06-13 NOTE — Progress Notes (Signed)
Subjective:     Patient ID: Heidi Serrano, female   DOB: 10/05/1955, 60 y.o.   MRN: [id number]  HPI patient presents stating she's getting pain on the inside of the right ankle that makes ambulation difficult. States it's been present for the last few weeks and does not remember injury   Review of Systems     Objective:   Physical Exam Neurovascular status intact muscle strength adequate with inflammation around the posterior tibial tendon right and also forefoot fluid buildup with inflammation noted with moderate discomfort when palpated    Assessment:     Inflammatory tendinitis right with probability for soft tissue issue or possibility of gout right    Plan:     Educated patient and took x-rays and today did careful sheath injection right 3 mg Kenalog 5 mg Xylocaine and applied ankle compression stocking advised on elevation and will see back again in 2 weeks. I then went ahead and checked and found there to be a negative Homans sign  X-ray report indicates no signs of fracture or arthritic condition

## 2016-06-19 ENCOUNTER — Ambulatory Visit (INDEPENDENT_AMBULATORY_CARE_PROVIDER_SITE_OTHER): Payer: Managed Care, Other (non HMO) | Admitting: Podiatry

## 2016-06-19 ENCOUNTER — Encounter: Payer: Self-pay | Admitting: Podiatry

## 2016-06-19 DIAGNOSIS — M7750 Other enthesopathy of unspecified foot: Secondary | ICD-10-CM | POA: Diagnosis not present

## 2016-06-19 DIAGNOSIS — M779 Enthesopathy, unspecified: Secondary | ICD-10-CM

## 2016-06-19 NOTE — Progress Notes (Signed)
Subjective:     Patient ID: Heidi Serrano, female   DOB: 08/07/1955, 60 y.o.   MRN: [id number]  HPI patient presents with quite a bit of discomfort in the right medial arch stating that the injection only helped temporarily and it's been sore for   Review of Systems     Objective:   Physical Exam Neurovascular status intact muscle strength adequate with inflammation around the posterior tibial tendon right as it comes under the medial malleolus with no indications of distention of the tendon or dysfunction    Assessment:     Posterior tibial tendinitis right with inflammation    Plan:     H&P condition reviewed and recommended immobilization with boot at the current time along with long-term orthotics. She is placed in a walking boot that she can wear for the next 2 weeks continuously and then try to reduce and then she is scanned for custom orthotics to lift the medial arch bilateral

## 2016-07-17 ENCOUNTER — Ambulatory Visit (INDEPENDENT_AMBULATORY_CARE_PROVIDER_SITE_OTHER): Payer: Managed Care, Other (non HMO) | Admitting: Podiatry

## 2016-07-17 ENCOUNTER — Encounter: Payer: Self-pay | Admitting: Podiatry

## 2016-07-17 DIAGNOSIS — M79671 Pain in right foot: Secondary | ICD-10-CM | POA: Diagnosis not present

## 2016-07-17 DIAGNOSIS — M722 Plantar fascial fibromatosis: Secondary | ICD-10-CM

## 2016-07-17 NOTE — Patient Instructions (Signed)

## 2016-07-17 NOTE — Progress Notes (Signed)
Subjective:     Patient ID: Heidi Serrano, female   DOB: 01/29/1956, 61 y.o.   MRN: [id number]  HPI patient states that the heel is slowly feeling better but that she still has forefoot pain and is here to pickup orthotics   Review of Systems     Objective:   Physical Exam Neurovascular status intact muscle strength adequate with patient's pain in the right forefoot not significant and improved from previous with the arch doing much better than previous    Assessment:     Plantar fasciitis improving but still present with mild forefoot capsulitis    Plan:     H&P condition reviewed and I've recommended padding therapy for this particular condition with considerations long-term for other activities depending on response. Reappoint 6 weeks or earlier if needed

## 2016-08-28 ENCOUNTER — Ambulatory Visit (INDEPENDENT_AMBULATORY_CARE_PROVIDER_SITE_OTHER): Payer: Managed Care, Other (non HMO) | Admitting: Podiatry

## 2016-08-28 ENCOUNTER — Ambulatory Visit (INDEPENDENT_AMBULATORY_CARE_PROVIDER_SITE_OTHER): Payer: Managed Care, Other (non HMO)

## 2016-08-28 DIAGNOSIS — M76822 Posterior tibial tendinitis, left leg: Secondary | ICD-10-CM

## 2016-08-28 DIAGNOSIS — M2141 Flat foot [pes planus] (acquired), right foot: Secondary | ICD-10-CM | POA: Diagnosis not present

## 2016-08-28 DIAGNOSIS — M2142 Flat foot [pes planus] (acquired), left foot: Secondary | ICD-10-CM

## 2016-08-28 DIAGNOSIS — M25571 Pain in right ankle and joints of right foot: Secondary | ICD-10-CM | POA: Diagnosis not present

## 2016-08-28 DIAGNOSIS — M25572 Pain in left ankle and joints of left foot: Secondary | ICD-10-CM

## 2016-08-28 DIAGNOSIS — M76821 Posterior tibial tendinitis, right leg: Secondary | ICD-10-CM

## 2016-08-28 DIAGNOSIS — T148XXA Other injury of unspecified body region, initial encounter: Secondary | ICD-10-CM

## 2016-08-29 NOTE — Addendum Note (Signed)
Addended by: Alphia Kava'CONNELL, VALERY D on: 08/29/2016 09:09 AM   Modules accepted: Orders

## 2016-08-29 NOTE — Progress Notes (Signed)
Subjective:     Patient ID: Heidi Serrano, female   DOB: 11/01/1955, 61 y.o.   MRN: [id number]  HPI patient states despite orthotic she continues to have significant discomfort and swelling on the inside of the right ankle and that the left one is also bothering her but nowhere near to the same degree and she thinks she's walking differently causing that. It is very tender and is failed to respond him mobilization sheath injection treatment orthotics and physical therapy   Review of Systems     Objective:   Physical Exam Neurovascular status intact negative Homans sign was noted with patient found to have acute inflammation around the posterior tibial tendon as it comes underneath the medial malleolus and inserts into the navicular right very tender when pressed and making walking difficult    Assessment:     Possible interstitial tear of the posterior tibial tendon right due to non-response to numerous conservative treatments    Plan:     Condition reviewed with patient and at this point I have recommended MRI to try to determine whether or not there may be a tear of the posterior tibial tendon due to failure to respond conservatively

## 2016-09-04 ENCOUNTER — Telehealth: Payer: Self-pay | Admitting: *Deleted

## 2016-09-04 NOTE — Telephone Encounter (Signed)
I called and informed Dondra SpryGail that Danella DeisShay from [employer] stated authorization is not needed for cpt code [id number].  I gave her the reference number.

## 2016-09-04 NOTE — Telephone Encounter (Signed)
"  Heidi LundborgDebra Serrano is scheduled to have a MRI on Friday at 12:10 pm.  She has Heidi Serrano and it requires authorization."  Okay, I will work on it.

## 2016-09-05 ENCOUNTER — Telehealth: Payer: Self-pay | Admitting: *Deleted

## 2016-09-05 NOTE — Telephone Encounter (Signed)
"  Im calling you back about Heidi Serrano.  When I called [employer] back they said authorization is required through Heidi Serrano.  [employer] had told you it wasn't needed.  Heidi Serrano was good."

## 2016-09-06 ENCOUNTER — Other Ambulatory Visit: Payer: Self-pay

## 2016-09-06 ENCOUNTER — Telehealth: Payer: Self-pay | Admitting: *Deleted

## 2016-09-06 ENCOUNTER — Ambulatory Visit
Admission: RE | Admit: 2016-09-06 | Discharge: 2016-09-06 | Disposition: A | Discharge status: Home / Self Care | Payer: Managed Care, Other (non HMO) | Source: Ambulatory Visit | Attending: Podiatry | Admitting: Podiatry

## 2016-09-06 NOTE — Telephone Encounter (Addendum)
Pt states she needs a call about an appt. 09/09/2016-Pt states she is calling for MRI results. Pt called again for results, I informed pt the results were in and I had informed Dr. Charlsie Merlesegal, but he was not in to review. I told her Dr. Charlsie Merlesegal preferred to go over the results with the pt at an appt, and offered to have scheduler call tomorrow. Pt states have them call before 1:00pm

## 2016-09-06 NOTE — Telephone Encounter (Addendum)
I called and left Dondra SpryGail a message that MRI was approved.  I gave her the case number which is [id number].  I called her again and gave her the authorization number which is [id number] and it is valid from 09/06/2016 to 12/05/2016.

## 2016-09-11 ENCOUNTER — Encounter: Payer: Self-pay | Admitting: Podiatry

## 2016-09-11 ENCOUNTER — Ambulatory Visit (INDEPENDENT_AMBULATORY_CARE_PROVIDER_SITE_OTHER): Payer: Managed Care, Other (non HMO) | Admitting: Podiatry

## 2016-09-11 DIAGNOSIS — T148XXA Other injury of unspecified body region, initial encounter: Secondary | ICD-10-CM | POA: Diagnosis not present

## 2016-09-11 DIAGNOSIS — M779 Enthesopathy, unspecified: Secondary | ICD-10-CM

## 2016-09-11 MED ORDER — TRIAMCINOLONE ACETONIDE 10 MG/ML IJ SUSP
10.0000 mg | Freq: Once | INTRAMUSCULAR | Status: AC
  Administered 2016-09-11: 10 mg

## 2016-09-11 MED ORDER — PREDNISONE 10 MG PO TABS: ORAL_TABLET | ORAL | 0 refills | Status: DC

## 2016-09-13 ENCOUNTER — Other Ambulatory Visit: Payer: Self-pay

## 2016-09-13 NOTE — Progress Notes (Signed)
Subjective:     Patient ID: Heidi Serrano, female   DOB: 01/28/1956, 61 y.o.   MRN: [id number]  HPI patient states the ankle is still hurting with maybe minimal improvement   Review of Systems     Objective:   Physical Exam Neurovascular status intact negative Homans sign noted with inflammation of the right posterior tib which is mildly improved but still quite sore at the insertion into the navicular    Assessment:     Posterior tibial tendinitis with still possibility for a interstitial tear despite no indications on MRI    Plan:     Reviewed this with her and since it does not appear to be torn according to the MRI I did go ahead and I did a sheath injection carefully with 3 mg dexamethasone Kenalog 5 mg Xylocaine advised on wearing her boot utilizing ice and bringing her orthotics in for modification with head orthotist. Reappoint to recheck

## 2016-09-25 ENCOUNTER — Other Ambulatory Visit: Payer: Managed Care, Other (non HMO)

## 2016-11-11 ENCOUNTER — Telehealth: Payer: Self-pay | Admitting: *Deleted

## 2016-11-11 NOTE — Telephone Encounter (Signed)
NOTES SENT TO SCHEDULING.  °

## 2017-03-12 ENCOUNTER — Ambulatory Visit (INDEPENDENT_AMBULATORY_CARE_PROVIDER_SITE_OTHER): Payer: Managed Care, Other (non HMO) | Admitting: Podiatry

## 2017-03-12 ENCOUNTER — Ambulatory Visit: Payer: Managed Care, Other (non HMO)

## 2017-03-12 ENCOUNTER — Encounter: Payer: Self-pay | Admitting: Podiatry

## 2017-03-12 DIAGNOSIS — T148XXA Other injury of unspecified body region, initial encounter: Secondary | ICD-10-CM

## 2017-03-12 DIAGNOSIS — M79671 Pain in right foot: Secondary | ICD-10-CM

## 2017-03-12 DIAGNOSIS — M779 Enthesopathy, unspecified: Secondary | ICD-10-CM

## 2017-03-12 NOTE — Progress Notes (Signed)
Subjective:    Patient ID: Heidi Serrano, female   DOB: 61 y.o.   MRN: [id number]   HPI patient presents to complete paperwork stating she's doing much better    ROS      Objective:  Physical Exam neurovascular status intact negative Homans sign noted     Assessment:  Doing well post tendinitis       Plan:  Continue with anti-inflammatories reappoint as needed
